# Patient Record
Sex: Male | Born: 2000 | Race: White | Hispanic: No | Marital: Single | Smoking: Never smoker
Health system: Southern US, Community
[De-identification: ages and names within clinical notes are randomized; demographics above are authoritative.]

## PROBLEM LIST (undated history)

## (undated) HISTORY — PX: MYRINGOTOMY: SUR874

---
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
## 2022-01-08 ENCOUNTER — Encounter: Payer: BC Managed Care – PPO | Admitting: Family Medicine

## 2022-01-28 ENCOUNTER — Telehealth: Payer: BC Managed Care – PPO | Admitting: Urgent Care

## 2022-01-28 DIAGNOSIS — J029 Acute pharyngitis, unspecified: Secondary | ICD-10-CM | POA: Diagnosis not present

## 2022-01-28 DIAGNOSIS — J0100 Acute maxillary sinusitis, unspecified: Secondary | ICD-10-CM

## 2022-01-28 MED ORDER — AMOXICILLIN-POT CLAVULANATE 875-125 MG PO TABS
1.0000 | ORAL_TABLET | Freq: Two times a day (BID) | ORAL | 0 refills | Status: AC
Start: 2022-01-28 — End: ?

## 2022-01-28 NOTE — Patient Instructions (Signed)
  Marc Ryan, thank you for joining Chaney Malling, PA for today's virtual visit.  While this provider is not your primary care provider (PCP), if your PCP is located in our provider database this encounter information will be shared with them immediately following your visit.   [facility] account gives you access to today's visit and all your visits, tests, and labs performed at [facility] " click here if you don't have a [facility] account or go to [email or web address]  Consent: (Patient) Marc Ryan provided verbal consent for this virtual visit at the beginning of the encounter.  Current Medications:  Current Outpatient Medications:    amoxicillin-clavulanate (AUGMENTIN) 875-125 MG tablet, Take 1 tablet by mouth 2 (two) times daily with a meal., Disp: 20 tablet, Rfl: 0   albuterol (VENTOLIN HFA) 108 (90 Base) MCG/ACT inhaler, Inhale 1-2 puffs into the lungs every 6 (six) hours as needed for wheezing or shortness of breath., Disp: 18 g, Rfl: 0   pseudoephedrine (SUDAFED) 60 MG tablet, Take 1 tablet (60 mg total) by mouth every 8 (eight) hours as needed for congestion., Disp: 30 tablet, Rfl: 0   Medications ordered in this encounter:  Meds ordered this encounter  Medications   amoxicillin-clavulanate (AUGMENTIN) 875-125 MG tablet    Sig: Take 1 tablet by mouth 2 (two) times daily with a meal.    Dispense:  20 tablet    Refill:  0    Order Specific Question:   Supervising Provider    Answer:   Chase Picket [id number]     *If you need refills on other medications prior to your next appointment, please contact your pharmacy*  Follow-Up: Call back or seek an in-person evaluation if the symptoms worsen or if the condition fails to improve as anticipated.  [place] [phone]  Other Instructions I suspect you are suffering from sinusitis, which is causing both the sore throat and ear pain.  Please start taking the  antibiotic, Augmentin, twice daily with food. Take it for all 10 days, do not stop early just because you feel better. Take an over the counter probiotic or yogurt daily to help prevent diarrhea/ yeast infection.  Start taking zyrtec once daily to help clear up the mucous. Use Flonase daily to help with inflammation of the nasal passage.  It is also recommended that you use nasal saline/ sinus washes to cleans the sinus passages. Hot steam from a shower or vaporizer may also be beneficial to help open up the upper airway. Eucalyptus can be helpful.  If any worsening symptoms such as headache, fever, or shortness of breath, please go to an in person evaluation/ urgent care.    If you have been instructed to have an in-person evaluation today at a local Urgent Care facility, please use the link below. It will take you to a list of all of our available [facility], including address, phone number and hours of operation. Please do not delay care.  [facility]  If you or a family member do not have a primary care provider, use the link below to schedule a visit and establish care. When you choose a [facility] primary care physician or advanced practice provider, you gain a long-term partner in health. Find a Primary Care Provider  Learn more about [facility]'s in-office and virtual care options: [facility]

## 2022-01-28 NOTE — Progress Notes (Signed)
Virtual Visit Consent   CAMP GOPAL, you are scheduled for a virtual visit with a [facility] provider today. Just as with appointments in the office, your consent must be obtained to participate. Your consent will be active for this visit and any virtual visit you may have with one of our providers in the next 365 days. If you have a MyChart account, a copy of this consent can be sent to you electronically.  As this is a virtual visit, video technology does not allow for your provider to perform a traditional examination. This may limit your provider's ability to fully assess your condition. If your provider identifies any concerns that need to be evaluated in person or the need to arrange testing (such as labs, EKG, etc.), we will make arrangements to do so. Although advances in technology are sophisticated, we cannot ensure that it will always work on either your end or our end. If the connection with a video visit is poor, the visit may have to be switched to a telephone visit. With either a video or telephone visit, we are not always able to ensure that we have a secure connection.  By engaging in this virtual visit, you consent to the provision of healthcare and authorize for your insurance to be billed (if applicable) for the services provided during this visit. Depending on your insurance coverage, you may receive a charge related to this service.  I need to obtain your verbal consent now. Are you willing to proceed with your visit today? Marc Ryan has provided verbal consent on 01/28/2022 for a virtual visit (video or telephone). Chaney Malling, PA  Date: 01/28/2022 8:38 PM  Virtual Visit via Video Note   I, [name], connected with  Marc Ryan  ([id number], 2000/10/04) on 01/28/22 at  8:30 PM EST by a video-enabled telemedicine application and verified that I am speaking with the correct person using two identifiers.  Location: Patient: Virtual Visit Location Patient:  Home Provider: Virtual Visit Location Provider: Home Office   I discussed the limitations of evaluation and management by telemedicine and the availability of in person appointments. The patient expressed understanding and agreed to proceed.    History of Present Illness: Marc Ryan is a 22 y.o. who identifies as a male who was assigned male at birth, and is being seen today for sinus pressure and sore throat.  HPI: Pleasant 22yo male presents to day with a 2 week hx of sinus/ head pressure and ear pain. Having post nasal drainage. He hunts with his family and reports every years in Dec/ Jan for the past several years has had similar sx. He started having a sore throat two days ago. Still having severe ear pressure. Pt uncertain if febrile. Admits to hx of strep in the past. Feels like his ears are popping and full. Pt has tonsils still, denies dysphagia, trismus or drooling. Normal appetite. No rash or GI sx. Has been taking sudafed intermittently which mildly helps when he takes it. No cough.   Problems: There are no problems to display for this patient.   Allergies: No Known Allergies Medications:  Current Outpatient Medications:    amoxicillin-clavulanate (AUGMENTIN) 875-125 MG tablet, Take 1 tablet by mouth 2 (two) times daily with a meal., Disp: 20 tablet, Rfl: 0   albuterol (VENTOLIN HFA) 108 (90 Base) MCG/ACT inhaler, Inhale 1-2 puffs into the lungs every 6 (six) hours as needed for wheezing or shortness of breath., Disp: 18  g, Rfl: 0   pseudoephedrine (SUDAFED) 60 MG tablet, Take 1 tablet (60 mg total) by mouth every 8 (eight) hours as needed for congestion., Disp: 30 tablet, Rfl: 0  Observations/Objective: Patient is well-developed, well-nourished in no acute distress.  Resting comfortably at home.  Head is normocephalic, atraumatic.  No labored breathing. No cough Speech is clear and coherent with logical content.  Patient is alert and oriented at baseline.  Tenderness to  self palpation of maxillary sinuses  Assessment and Plan: 1. Acute non-recurrent maxillary sinusitis  2. Pharyngitis, unspecified etiology  Chart review show recurrence of same for the past two years. Has been seen in clinic for these, dx with sinus infections in the past. Risk factors include allergies and exposure to environmental triggers. Pt may continue sudafed and tylenol, has flonase and zyrtec at home. Recommended he re-start them both. Will add augmentin to cover for sinus/ear/throat infection.  Follow Up Instructions: I discussed the assessment and treatment plan with the patient. The patient was provided an opportunity to ask questions and all were answered. The patient agreed with the plan and demonstrated an understanding of the instructions.  A copy of instructions were sent to the patient via MyChart unless otherwise noted below.   The patient was advised to call back or seek an in-person evaluation if the symptoms worsen or if the condition fails to improve as anticipated.  Time:  I spent 8 minutes with the patient via telehealth technology discussing the above problems/concerns.    [place], PA

## 2022-02-13 DIAGNOSIS — L720 Epidermal cyst: Secondary | ICD-10-CM | POA: Diagnosis not present

## 2022-06-27 ENCOUNTER — Telehealth: Payer: BC Managed Care – PPO | Admitting: Family Medicine

## 2022-06-27 ENCOUNTER — Telehealth: Payer: BC Managed Care – PPO

## 2022-06-27 DIAGNOSIS — J069 Acute upper respiratory infection, unspecified: Secondary | ICD-10-CM | POA: Diagnosis not present

## 2022-06-27 MED ORDER — BENZONATATE 100 MG PO CAPS: 100.0000 mg | ORAL_CAPSULE | Freq: Three times a day (TID) | ORAL | 0 refills | Status: DC | PRN

## 2022-06-27 MED ORDER — IPRATROPIUM BROMIDE 0.03 % NA SOLN: 2.0000 | Freq: Two times a day (BID) | NASAL | 0 refills | Status: DC

## 2022-06-27 NOTE — Progress Notes (Signed)
° °E-Visit for Upper Respiratory Infection  ° °We are sorry you are not feeling well.  Here is how we plan to help! ° °Based on what you have shared with me, it looks like you may have a viral upper respiratory infection.  Upper respiratory infections are caused by a large number of viruses; however, rhinovirus is the most common cause.  ° °Symptoms vary from person to person, with common symptoms including sore throat, cough, fatigue or lack of energy and feeling of general discomfort.  A low-grade fever of up to 100.4 may present, but is often uncommon.  Symptoms vary however, and are closely related to a person's age or underlying illnesses.  The most common symptoms associated with an upper respiratory infection are nasal discharge or congestion, cough, sneezing, headache and pressure in the ears and face.  These symptoms usually persist for about 3 to 10 days, but can last up to 2 weeks.  It is important to know that upper respiratory infections do not cause serious illness or complications in most cases.   ° °Upper respiratory infections can be transmitted from person to person, with the most common method of transmission being a person's hands.  The virus is able to live on the skin and can infect other persons for up to 2 hours after direct contact.  Also, these can be transmitted when someone coughs or sneezes; thus, it is important to cover the mouth to reduce this risk.  To keep the spread of the illness at bay, good hand hygiene is very important. ° °This is an infection that is most likely caused by a virus. There are no specific treatments other than to help you with the symptoms until the infection runs its course.  We are sorry you are not feeling well.  Here is how we plan to help! ° ° °For nasal congestion, you may use an oral decongestants such as Mucinex D or if you have glaucoma or high blood pressure use plain Mucinex.  Saline nasal spray or nasal drops can help and can safely be used as often  as needed for congestion.  For your congestion, I have prescribed Ipratropium Bromide nasal spray 0.03% two sprays in each nostril 2-3 times a day ° °If you do not have a history of heart disease, hypertension, diabetes or thyroid disease, prostate/bladder issues or glaucoma, you may also use Sudafed to treat nasal congestion.  It is highly recommended that you consult with a pharmacist or your primary care physician to ensure this medication is safe for you to take.    ° °If you have a cough, you may use cough suppressants such as Delsym and Robitussin.  If you have glaucoma or high blood pressure, you can also use Coricidin HBP.   °For cough I have prescribed for you A prescription cough medication called Tessalon Perles 100 mg. You may take 1-2 capsules every 8 hours as needed for cough ° °If you have a sore or scratchy throat, use a saltwater gargle- ¼ to ½ teaspoon of salt dissolved in a 4-ounce to 8-ounce glass of warm water.  Gargle the solution for approximately 15-30 seconds and then spit.  It is important not to swallow the solution.  You can also use throat lozenges/cough drops and Chloraseptic spray to help with throat pain or discomfort.  Warm or cold liquids can also be helpful in relieving throat pain. ° °For headache, pain or general discomfort, you can use Ibuprofen or Tylenol as directed.   °  Some authorities believe that zinc sprays or the use of Echinacea may shorten the course of your symptoms. ° ° °HOME CARE °Only take medications as instructed by your medical team. °Be sure to drink plenty of fluids. Water is fine as well as fruit juices, sodas and electrolyte beverages. You may want to stay away from caffeine or alcohol. If you are nauseated, try taking small sips of liquids. How do you know if you are getting enough fluid? Your urine should be a pale yellow or almost colorless. °Get rest. °Taking a steamy shower or using a humidifier may help nasal congestion and ease sore throat pain. You  can place a towel over your head and breathe in the steam from hot water coming from a faucet. °Using a saline nasal spray works much the same way. °Cough drops, hard candies and sore throat lozenges may ease your cough. °Avoid close contacts especially the very young and the elderly °Cover your mouth if you cough or sneeze °Always remember to wash your hands.  ° °GET HELP RIGHT AWAY IF: °You develop worsening fever. °If your symptoms do not improve within 10 days °You develop yellow or green discharge from your nose over 3 days. °You have coughing fits °You develop a severe head ache or visual changes. °You develop shortness of breath, difficulty breathing or start having chest pain °Your symptoms persist after you have completed your treatment plan ° °MAKE SURE YOU  °Understand these instructions. °Will watch your condition. °Will get help right away if you are not doing well or get worse. ° °Thank you for choosing an e-visit. ° °Your e-visit answers were reviewed by a board certified advanced clinical practitioner to complete your personal care plan. Depending upon the condition, your plan could have included both over the counter or prescription medications. ° °Please review your pharmacy choice. Make sure the pharmacy is open so you can pick up prescription now. If there is a problem, you may contact your provider through MyChart messaging and have the prescription routed to another pharmacy.  Your safety is important to us. If you have drug allergies check your prescription carefully.  ° °For the next 24 hours you can use MyChart to ask questions about today's visit, request a non-urgent call back, or ask for a work or school excuse. °You will get an email in the next two days asking about your experience. I hope that your e-visit has been valuable and will speed your recovery. ° ° ° °I provided 5 minutes of non face-to-face time during this encounter for chart review, medication and order placement, as well  as and documentation.  ° °

## 2022-07-04 ENCOUNTER — Encounter: Payer: BC Managed Care – PPO | Admitting: Nurse Practitioner

## 2022-07-08 DIAGNOSIS — L720 Epidermal cyst: Secondary | ICD-10-CM | POA: Diagnosis not present

## 2022-07-10 ENCOUNTER — Ambulatory Visit (INDEPENDENT_AMBULATORY_CARE_PROVIDER_SITE_OTHER): Payer: BC Managed Care – PPO | Admitting: Nurse Practitioner

## 2022-07-10 VITALS — BP 112/62 | HR 59 | Ht 72.0 in | Wt 180.0 lb

## 2022-07-10 DIAGNOSIS — Z0000 Encounter for general adult medical examination without abnormal findings: Secondary | ICD-10-CM

## 2022-07-10 NOTE — Progress Notes (Signed)
Subjective:    Patient ID: Marc Ryan, male    DOB: 10/15/2000, 22 y.o.   MRN: [id number]  HPI  The patient comes in today for a wellness visit.    A review of their health history was completed.  A review of medications was also completed.  Any needed refills; no  Eating habits: fair  Falls/  MVA accidents in past few months: no  Regular exercise: mowing grass, hunting, fishing  Specialist pt sees on regular basis: no  Preventative health issues were discussed.   Additional concerns: none- recent cyst removal by dermatology on abx for it   Working full-time with several other jobs on the side.  Stays very active. Has had 1 male sexual partner, no vaginal intercourse.  Defers STD testing at this time. Regular dental care.    07/10/2022    2:01 PM  Depression screen PHQ 2/9  Decreased Interest 0  Down, Depressed, Hopeless 0  PHQ - 2 Score 0  Altered sleeping 0  Tired, decreased energy 1  Change in appetite 0  Feeling bad or failure about yourself  0  Trouble concentrating 0  Moving slowly or fidgety/restless 0  Suicidal thoughts 0  PHQ-9 Score 1  Difficult doing work/chores Not difficult at all      07/10/2022    2:02 PM  GAD 7 : Generalized Anxiety Score  Nervous, Anxious, on Edge 0  Control/stop worrying 0  Worry too much - different things 0  Trouble relaxing 1  Restless 0  Easily annoyed or irritable 0  Afraid - awful might happen 0  Total GAD 7 Score 1  Anxiety Difficulty Not difficult at all     Review of Systems  Constitutional:  Negative for activity change, appetite change and fatigue.  HENT:  Negative for dental problem.   Respiratory:  Negative for cough, chest tightness, shortness of breath and wheezing.   Cardiovascular:  Negative for chest pain.  Gastrointestinal:  Negative for abdominal distention, abdominal pain, constipation, diarrhea, nausea and vomiting.  Genitourinary:  Negative for difficulty urinating, dysuria,  frequency, genital sores, penile discharge, penile pain, penile swelling, scrotal swelling, testicular pain and urgency.  Psychiatric/Behavioral:  Negative for sleep disturbance and suicidal ideas.        Objective:   Physical Exam Vitals and nursing note reviewed.  Constitutional:      General: He is not in acute distress.    Appearance: He is well-developed.  Neck:     Thyroid: No thyromegaly.     Trachea: No tracheal deviation.  Cardiovascular:     Rate and Rhythm: Normal rate and regular rhythm.     Heart sounds: Normal heart sounds.  Pulmonary:     Effort: Pulmonary effort is normal.     Breath sounds: Normal breath sounds.  Abdominal:     General: There is no distension.     Palpations: Abdomen is soft.     Tenderness: There is no abdominal tenderness.  Genitourinary:    Comments: Defers GU exam.  Denies any problems. Musculoskeletal:     Cervical back: Normal range of motion and neck supple.  Lymphadenopathy:     Cervical: No cervical adenopathy.  Skin:    General: Skin is warm and dry.     Comments: Has a bandage over the right posterior neck area from previous cyst removal.  Neurological:     Mental Status: He is alert and oriented to person, place, and time.     Gait: Gait  normal.     Deep Tendon Reflexes: Reflexes are normal and symmetric.  Psychiatric:        Mood and Affect: Mood normal.        Behavior: Behavior normal.        Thought Content: Thought content normal.        Judgment: Judgment normal.    Today's Vitals   07/10/22 1358  BP: 112/62  Pulse: (!) 59  SpO2: 99%  Weight: 180 lb (81.6 kg)  Height: 6' (1.829 m)   Body mass index is 24.41 kg/m.         Assessment & Plan:  Wellness examination  No lab work indicated at this time.  Defers STD testing. Discussed healthy diet, continued activity and safe sex practices.  Also discussed other safety issues such as seatbelts, no texting while driving and wearing helmets. Return in about 1  year (around 07/10/2023) for physical.

## 2022-07-11 ENCOUNTER — Encounter: Payer: Self-pay | Admitting: Nurse Practitioner

## 2022-12-06 ENCOUNTER — Ambulatory Visit: Payer: BC Managed Care – PPO | Admitting: Nurse Practitioner

## 2023-04-22 DIAGNOSIS — L720 Epidermal cyst: Secondary | ICD-10-CM | POA: Diagnosis not present

## 2023-04-28 DIAGNOSIS — L720 Epidermal cyst: Secondary | ICD-10-CM | POA: Diagnosis not present

## 2024-01-14 ENCOUNTER — Inpatient Hospital Stay: Admission: RE | Admit: 2024-01-14 | Discharge: 2024-01-14 | Payer: Self-pay

## 2024-01-14 ENCOUNTER — Telehealth: Payer: Self-pay | Admitting: Emergency Medicine

## 2024-01-14 VITALS — BP 135/77 | HR 79 | Temp 99.1°F | Resp 18

## 2024-01-14 DIAGNOSIS — J101 Influenza due to other identified influenza virus with other respiratory manifestations: Secondary | ICD-10-CM

## 2024-01-14 LAB — POC COVID19/FLU A&B COMBO
Covid Antigen, POC: NEGATIVE
Influenza A Antigen, POC: POSITIVE — AB
Influenza B Antigen, POC: NEGATIVE

## 2024-01-14 MED ORDER — OSELTAMIVIR PHOSPHATE 75 MG PO CAPS: 75.0000 mg | ORAL_CAPSULE | Freq: Two times a day (BID) | ORAL | 0 refills | Status: DC

## 2024-01-14 MED ORDER — OSELTAMIVIR PHOSPHATE 75 MG PO CAPS: 75.0000 mg | ORAL_CAPSULE | Freq: Two times a day (BID) | ORAL | 0 refills | Status: AC

## 2024-01-14 MED ORDER — PROMETHAZINE-DM 6.25-15 MG/5ML PO SYRP: 5.0000 mL | ORAL_SOLUTION | Freq: Four times a day (QID) | ORAL | 0 refills | Status: AC | PRN

## 2024-01-14 MED ORDER — AZELASTINE HCL 0.1 % NA SOLN: 1.0000 | Freq: Two times a day (BID) | NASAL | 0 refills | Status: AC

## 2024-01-14 MED ORDER — PROMETHAZINE-DM 6.25-15 MG/5ML PO SYRP: 5.0000 mL | ORAL_SOLUTION | Freq: Four times a day (QID) | ORAL | 0 refills | Status: DC | PRN

## 2024-01-14 NOTE — ED Provider Notes (Signed)
 [facility]    CSN: [id number] Arrival date & time: 01/14/24  1751      History   Chief Complaint Chief Complaint  Patient presents with   Nasal Congestion    I am having cough and congestion in my chest. I have had the cough and congestion for several weeks. I also started with chills and acheness - Entered by patient    HPI Marc Ryan is a 23 y.o. male.   Patient presenting today with several day history of chills, body aches, congestion, cough, fatigue.  Denies chest pain, shortness of breath, abdominal pain, vomiting, diarrhea.  So far taking Robitussin and DayQuil and NyQuil with minimal relief.  History of asthma not currently on any inhaler.    Past Medical History:  Diagnosis Date   Asthma     There are no active problems to display for this patient.   Past Surgical History:  Procedure Laterality Date   MYRINGOTOMY         Home Medications    Prior to Admission medications  Medication Sig Start Date End Date Taking? Authorizing Provider  azelastine  (ASTELIN ) 0.1 % nasal spray Place 1 spray into both nostrils 2 (two) times daily. Use in each nostril as directed 01/14/24   Stuart Vernell Norris, PA-C  oseltamivir  (TAMIFLU ) 75 MG capsule Take 1 capsule (75 mg total) by mouth every 12 (twelve) hours. 01/14/24   Stuart Vernell Norris, PA-C  promethazine -dextromethorphan (PROMETHAZINE -DM) 6.25-15 MG/5ML syrup Take 5 mLs by mouth 4 (four) times daily as needed. 01/14/24   Stuart Vernell Norris, PA-C    Family History History reviewed. No pertinent family history.  Social History Social History[1]   Allergies   Patient has no known allergies.   Review of Systems Review of Systems PER HPI  Physical Exam Triage Vital Signs ED Triage Vitals  Encounter Vitals Group     BP 01/14/24 1758 135/77     Girls Systolic BP Percentile --      Girls Diastolic BP Percentile --      Boys Systolic BP Percentile --      Boys Diastolic BP  Percentile --      Pulse Rate 01/14/24 1758 79     Resp 01/14/24 1758 18     Temp 01/14/24 1758 99.1 F (37.3 C)     Temp Source 01/14/24 1758 Oral     SpO2 01/14/24 1758 98 %     Weight --      Height --      Head Circumference --      Peak Flow --      Pain Score 01/14/24 1800 0     Pain Loc --      Pain Education --      Exclude from Growth Chart --    No data found.  Updated Vital Signs BP 135/77 (BP Location: Right Arm)   Pulse 79   Temp 99.1 F (37.3 C) (Oral)   Resp 18   SpO2 98%   Visual Acuity Right Eye Distance:   Left Eye Distance:   Bilateral Distance:    Right Eye Near:   Left Eye Near:    Bilateral Near:     Physical Exam Vitals and nursing note reviewed.  Constitutional:      Appearance: He is well-developed.  HENT:     Head: Atraumatic.     Right Ear: External ear normal.     Left Ear: External ear normal.  Nose: Rhinorrhea present.     Mouth/Throat:     Pharynx: Posterior oropharyngeal erythema present. No oropharyngeal exudate.  Eyes:     Conjunctiva/sclera: Conjunctivae normal.     Pupils: Pupils are equal, round, and reactive to light.  Cardiovascular:     Rate and Rhythm: Normal rate and regular rhythm.  Pulmonary:     Effort: Pulmonary effort is normal. No respiratory distress.     Breath sounds: No wheezing or rales.  Musculoskeletal:        General: Normal range of motion.     Cervical back: Normal range of motion and neck supple.  Lymphadenopathy:     Cervical: No cervical adenopathy.  Skin:    General: Skin is warm and dry.  Neurological:     Mental Status: He is alert and oriented to person, place, and time.  Psychiatric:        Behavior: Behavior normal.      UC Treatments / Results  Labs (all labs ordered are listed, but only abnormal results are displayed) Labs Reviewed  POC COVID19/FLU A&B COMBO - Abnormal; Notable for the following components:      Result Value   Influenza A Antigen, POC Positive (*)    All  other components within normal limits    EKG   Radiology No results found.  Procedures Procedures (including critical care time)  Medications Ordered in UC Medications - No data to display  Initial Impression / Assessment and Plan / UC Course  I have reviewed the triage vital signs and the nursing notes.  Pertinent labs & imaging results that were available during my care of the patient were reviewed by me and considered in my medical decision making (see chart for details).     Rapid flu positive for influenza A.  Treat with Tamiflu , Astelin , Phenergan  DM, supportive over-the-counter medications and home care.  Return for worsening or unresolving symptoms.  Final Clinical Impressions(s) / UC Diagnoses   Final diagnoses:  Influenza A   Discharge Instructions   None    ED Prescriptions     Medication Sig Dispense Auth. Provider   oseltamivir  (TAMIFLU ) 75 MG capsule Take 1 capsule (75 mg total) by mouth every 12 (twelve) hours. 10 capsule Stuart Vernell Norris, PA-C   azelastine  (ASTELIN ) 0.1 % nasal spray Place 1 spray into both nostrils 2 (two) times daily. Use in each nostril as directed 30 mL Stuart Vernell Norris, PA-C   promethazine -dextromethorphan (PROMETHAZINE -DM) 6.25-15 MG/5ML syrup Take 5 mLs by mouth 4 (four) times daily as needed. 100 mL Stuart Vernell Norris, [place]      PDMP not reviewed this encounter.    [1]  Social History Tobacco Use   Smoking status: Never   Smokeless tobacco: Never  Vaping Use   Vaping status: Never Used  Substance Use Topics   Alcohol use: No   Drug use: No     Stuart Vernell Norris, PA-C 01/14/24 1906

## 2024-01-14 NOTE — ED Triage Notes (Signed)
 Runny nose, cough x 2 weeks.  Has been taking robitussin.  States over the weekend symptoms returned with chills and body aches.

## 2024-01-14 NOTE — Telephone Encounter (Signed)
 Patient requested medications be sent to [facility] instead of [facility].
